# Patient Record
Sex: Female | Born: 2000 | Race: Black or African American | Hispanic: No | Marital: Single | Smoking: Never smoker
Health system: Southern US, Community
[De-identification: ages and names within clinical notes are randomized; demographics above are authoritative.]

## PROBLEM LIST (undated history)

## (undated) DIAGNOSIS — T7840XA Allergy, unspecified, initial encounter: Secondary | ICD-10-CM

## (undated) HISTORY — DX: Allergy, unspecified, initial encounter: T78.40XA

---
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
## 2022-01-04 DIAGNOSIS — O3680X0 Pregnancy with inconclusive fetal viability, not applicable or unspecified: Secondary | ICD-10-CM | POA: Diagnosis not present

## 2022-01-25 DIAGNOSIS — Z3401 Encounter for supervision of normal first pregnancy, first trimester: Secondary | ICD-10-CM | POA: Diagnosis not present

## 2022-01-25 DIAGNOSIS — Z113 Encounter for screening for infections with a predominantly sexual mode of transmission: Secondary | ICD-10-CM | POA: Diagnosis not present

## 2022-02-08 DIAGNOSIS — Z3401 Encounter for supervision of normal first pregnancy, first trimester: Secondary | ICD-10-CM | POA: Diagnosis not present

## 2022-02-22 DIAGNOSIS — O361990 Maternal care for other isoimmunization, unspecified trimester, not applicable or unspecified: Secondary | ICD-10-CM | POA: Insufficient documentation

## 2022-02-22 DIAGNOSIS — Z141 Cystic fibrosis carrier: Secondary | ICD-10-CM | POA: Diagnosis not present

## 2022-02-22 DIAGNOSIS — O361920 Maternal care for other isoimmunization, second trimester, not applicable or unspecified: Secondary | ICD-10-CM | POA: Diagnosis not present

## 2022-02-22 DIAGNOSIS — Z3A13 13 weeks gestation of pregnancy: Secondary | ICD-10-CM | POA: Diagnosis not present

## 2022-03-30 DIAGNOSIS — O352XX0 Maternal care for (suspected) hereditary disease in fetus, not applicable or unspecified: Secondary | ICD-10-CM | POA: Diagnosis not present

## 2022-03-30 DIAGNOSIS — Z141 Cystic fibrosis carrier: Secondary | ICD-10-CM | POA: Diagnosis not present

## 2022-03-30 DIAGNOSIS — Z8279 Family history of other congenital malformations, deformations and chromosomal abnormalities: Secondary | ICD-10-CM | POA: Diagnosis not present

## 2022-03-30 DIAGNOSIS — Z3686 Encounter for antenatal screening for cervical length: Secondary | ICD-10-CM | POA: Diagnosis not present

## 2022-03-30 DIAGNOSIS — Z3A18 18 weeks gestation of pregnancy: Secondary | ICD-10-CM | POA: Diagnosis not present

## 2022-03-30 DIAGNOSIS — O360920 Maternal care for other rhesus isoimmunization, second trimester, not applicable or unspecified: Secondary | ICD-10-CM | POA: Diagnosis not present

## 2022-03-30 DIAGNOSIS — O99210 Obesity complicating pregnancy, unspecified trimester: Secondary | ICD-10-CM | POA: Diagnosis not present

## 2022-04-24 ENCOUNTER — Encounter (HOSPITAL_BASED_OUTPATIENT_CLINIC_OR_DEPARTMENT_OTHER): Payer: Self-pay | Admitting: Advanced Practice Midwife

## 2022-04-24 ENCOUNTER — Ambulatory Visit (INDEPENDENT_AMBULATORY_CARE_PROVIDER_SITE_OTHER): Payer: Medicaid Other | Admitting: Advanced Practice Midwife

## 2022-04-24 VITALS — BP 127/67 | HR 94 | Wt 240.8 lb

## 2022-04-24 DIAGNOSIS — M549 Dorsalgia, unspecified: Secondary | ICD-10-CM

## 2022-04-24 DIAGNOSIS — Z3402 Encounter for supervision of normal first pregnancy, second trimester: Secondary | ICD-10-CM

## 2022-04-24 DIAGNOSIS — Z3400 Encounter for supervision of normal first pregnancy, unspecified trimester: Secondary | ICD-10-CM | POA: Insufficient documentation

## 2022-04-24 DIAGNOSIS — O99891 Other specified diseases and conditions complicating pregnancy: Secondary | ICD-10-CM

## 2022-04-24 MED ORDER — BLOOD PRESSURE KIT DEVI: 1.0000 | Freq: Once | 0 refills | Status: AC

## 2022-04-24 NOTE — Progress Notes (Signed)
     Subjective:   Megan Howell is a 21 y.o. G1 at [redacted]w[redacted]d  by early ultrasound being seen today for her first obstetrical visit as a transfer from [place].  Her obstetrical history is significant for teen pregnancy,  and has Seasonal allergies; Supervision of normal first pregnancy; Maternal atypical antibody; and Cystic fibrosis carrier on their problem list.. Patient does intend to breast feed. Pregnancy history fully reviewed.  Patient reports no complaints.  HISTORY: OB History  Gravida Para Term Preterm AB Living  1 0 0 0 0 0  SAB IAB Ectopic Multiple Live Births  0 0 0 0 0    # Outcome Date GA Lbr Len/2nd Weight Sex Delivery Anes PTL Lv  1 Current            Past Medical History:  Diagnosis Date   Allergy    seasonal allergies   Past Surgical History:  Procedure Laterality Date   ADENOIDECTOMY  07/2011   Dr. Benjamine Mola   TONSILLECTOMY  07/2011   No family history on file. Social History   Tobacco Use   Smoking status: Never   Smokeless tobacco: Never   No Known Allergies Current Outpatient Medications on File Prior to Visit  Medication Sig Dispense Refill   ferrous sulfate 325 (65 FE) MG tablet Take 325 mg by mouth daily with breakfast.     Prenatal Vit-Fe Fumarate-FA (PRENATAL MULTIVITAMIN) TABS tablet Take 1 tablet by mouth daily at 12 noon.     fexofenadine (ALLEGRA) 60 MG tablet Take 1 tablet (60 mg total) by mouth 2 (two) times daily. For allergy symptom control (Patient not taking: Reported on 04/24/2022) 60 tablet 6   fluticasone (FLONASE) 50 MCG/ACT nasal spray Sniff one spray into each nostril once daily for allergy symptom control (Patient not taking: Reported on 04/24/2022) 16 g 12   Vitamin D, Ergocalciferol, (DRISDOL) 1.25 MG (50000 UNIT) CAPS capsule Take one capsule by mouth once every 7 days for total of 8 weeks to treat low vitamin D level (Patient not taking: Reported on 04/24/2022) 8 capsule 0   No current facility-administered medications on file prior  to visit.    Exam   There were no vitals filed for this visit.     VS reviewed, nursing note reviewed,  Constitutional: well developed, well nourished, no distress HEENT: normocephalic CV: normal rate Pulm/chest wall: normal effort Abdomen: soft Neuro: alert and oriented x 3 Skin: warm, dry Psych: affect normal   Assessment:   Pregnancy: No obstetric history on file. 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 Back pain affecting pregnancy in second trimester --Rest/ice/heat/warm bath/increase PO fluids/Tylenol/pregnancy support belt  - Ambulatory referral to Physical Therapy  2. Encounter for supervision of normal first pregnancy in second trimester --Anticipatory guidance about next visits/weeks of pregnancy given.   - [place] MFM OB DETAIL +14 WK; Future - Babyscripts Schedule Optimization    Initial labs reviewed Continue prenatal vitamins. Ultrasound discussed; fetal anatomic survey: ordered. Problem list reviewed and updated. The nature of [facility] with multiple MDs and other Advanced Practice Providers was explained to patient; also emphasized that residents, students are part of our team. Routine obstetric precautions reviewed.  F/U in 4 weeks   Fatima Blank, CNM 04/26/22 8:51 PM

## 2022-05-14 ENCOUNTER — Ambulatory Visit: Payer: Medicaid Other | Admitting: *Deleted

## 2022-05-14 ENCOUNTER — Ambulatory Visit: Payer: Medicaid Other | Attending: Obstetrics & Gynecology

## 2022-05-14 VITALS — BP 132/58 | HR 93

## 2022-05-14 DIAGNOSIS — Z8279 Family history of other congenital malformations, deformations and chromosomal abnormalities: Secondary | ICD-10-CM | POA: Diagnosis not present

## 2022-05-14 DIAGNOSIS — Z3402 Encounter for supervision of normal first pregnancy, second trimester: Secondary | ICD-10-CM

## 2022-05-14 DIAGNOSIS — O99212 Obesity complicating pregnancy, second trimester: Secondary | ICD-10-CM | POA: Diagnosis not present

## 2022-05-14 DIAGNOSIS — O361910 Maternal care for other isoimmunization, first trimester, not applicable or unspecified: Secondary | ICD-10-CM | POA: Diagnosis not present

## 2022-05-14 DIAGNOSIS — Z3A25 25 weeks gestation of pregnancy: Secondary | ICD-10-CM | POA: Diagnosis not present

## 2022-05-14 DIAGNOSIS — Z363 Encounter for antenatal screening for malformations: Secondary | ICD-10-CM | POA: Insufficient documentation

## 2022-05-14 DIAGNOSIS — O361920 Maternal care for other isoimmunization, second trimester, not applicable or unspecified: Secondary | ICD-10-CM | POA: Diagnosis not present

## 2022-05-14 DIAGNOSIS — O09892 Supervision of other high risk pregnancies, second trimester: Secondary | ICD-10-CM | POA: Insufficient documentation

## 2022-05-14 DIAGNOSIS — E669 Obesity, unspecified: Secondary | ICD-10-CM

## 2022-05-15 ENCOUNTER — Other Ambulatory Visit: Payer: Self-pay | Admitting: *Deleted

## 2022-05-15 DIAGNOSIS — O99212 Obesity complicating pregnancy, second trimester: Secondary | ICD-10-CM

## 2022-05-15 DIAGNOSIS — O09892 Supervision of other high risk pregnancies, second trimester: Secondary | ICD-10-CM

## 2022-05-15 DIAGNOSIS — O361920 Maternal care for other isoimmunization, second trimester, not applicable or unspecified: Secondary | ICD-10-CM

## 2022-05-23 ENCOUNTER — Ambulatory Visit (INDEPENDENT_AMBULATORY_CARE_PROVIDER_SITE_OTHER): Payer: Medicaid Other | Admitting: Obstetrics & Gynecology

## 2022-05-23 VITALS — BP 115/58 | HR 96 | Wt 249.0 lb

## 2022-05-23 DIAGNOSIS — Z23 Encounter for immunization: Secondary | ICD-10-CM | POA: Diagnosis not present

## 2022-05-23 DIAGNOSIS — Z3402 Encounter for supervision of normal first pregnancy, second trimester: Secondary | ICD-10-CM | POA: Diagnosis not present

## 2022-05-23 DIAGNOSIS — Z141 Cystic fibrosis carrier: Secondary | ICD-10-CM

## 2022-05-23 DIAGNOSIS — O0932 Supervision of pregnancy with insufficient antenatal care, second trimester: Secondary | ICD-10-CM

## 2022-05-23 DIAGNOSIS — O361920 Maternal care for other isoimmunization, second trimester, not applicable or unspecified: Secondary | ICD-10-CM

## 2022-05-23 DIAGNOSIS — Z6837 Body mass index (BMI) 37.0-37.9, adult: Secondary | ICD-10-CM

## 2022-05-23 MED ORDER — ASPIRIN 81 MG PO TBEC: 81.0000 mg | DELAYED_RELEASE_TABLET | Freq: Every day | ORAL | 12 refills | Status: AC

## 2022-05-23 NOTE — Patient Instructions (Signed)
AREA PEDIATRIC/FAMILY PRACTICE PHYSICIANS  [place]outheast [place] ([address])  [facility] Chambliss, MD; Eniola, MD; Hale, MD; Hensel, MD; McDiarmid, MD; McIntyer, MD; Neal, MD; Walden, MD [address] [place], [place] [address] [phone] Mon-Fri 8:30-12:30, 1:30-5:00 Providers come to see babies at [facility] Accepting Medicaid [facility] providers who accept newborns: Koirala, MD; Morrow, MD; Wolters, MD [address], [place], [place] [address] [phone] Mon-Fri 8:00-5:30 Babies seen by providers at [facility] Does NOT accept Medicaid Please call early in hospitalization for appointment (limited availability)  [facility] Mulberry, MD [address] [place], [place] [address] [phone] Mon, Tue, Thur, Fri 8:30-5:00, Wed 10:00-7:00 (closed 1-2pm) Babies seen by [facility] providers Accepting Medicaid Rubin - Pediatrician Rubin, MD [address], [place], [place] [address] [phone] Mon-Fri 8:30-5:00, Sat 8:30-12:00 Provider comes to see babies at [facility] Accepting Medicaid Must have been referred from current patients or contacted office prior to delivery [facility] ([facility]) Brown, MD; Chandler, MD; Ettefagh, MD; Grant, MD; Lester, MD; McCormick, MD; McQueen, MD; Prose, MD; Simha, MD; Stanley, MD; Stryffeler, NP; Tebben, NP [address], [place], [place] [address] [phone] Mon, Tue, Thur, Fri 8:30-5:30, Wed 9:30-5:30, Sat 8:30-12:30 Babies seen by [facility] providers Accepting Medicaid Only accepting infants of first-time parents or siblings of current patients Hospital discharge coordinator will make follow-up appointment Jack Amos [address], [place], [place]  [address] [phone]   Fax - [phone] [facility] [address], [place], [place]  [address] Phone - [phone]   Fax - [phone] Shilpa Gosrani [address], [place], [place]  [address] [phone]  [place]ortheast [place] ([address]) [facility] Bates, MD; [place]assfield, MD; Cooper, Cox, MD; MD; Davis, MD; Dovico, MD; Ettefaugh, MD; Little, MD; Lowe, MD; Keiffer, MD; Melvin, MD; Sumner, MD; Williams, MD [address], [place], [place] [address] [phone] Mon-Fri 8:30-5:00 (extended evenings Mon-Thur as needed), Sat-Sun 10:00-1:00 Providers come to see babies at [facility] Accepting Medicaid for families of first-time babies and families with all children in the household age 3 and under. Must register with office prior to making appointment (M-F only). [facility] Henson, NP; Knapp, MD; Lalonde, MD; Tysinger, PA [address] [place], [place] [address] [phone] Mon-Fri 8:00-5:00 Babies seen by providers at [facility] Does NOT accept Medicaid/Commercial Insurance Only [facility] ([facility])  Artis, MD; Barnes, MD; Bratton, MD; Coccaro, MD; Lockett Gardner, MD; Kramer, MD; Marshall, MD; Netherton, MD; Poleto, MD; Skinner, MD [address] [place], [place] [address] [phone] Mon-Fri 8:30-5:30, Sat (Oct.-Mar.) 9:00-1:00 Babies seen by providers at [facility] Accepting Medicaid  [place] ([address]) [facility] Reid, MD; Warner, MD [address], [place], [place] [address] [phone] Mon-Fri 8:30-5:00, Sat 8:30-12:00 Providers come to see babies at [facility] Does NOT accept Medicaid [facility] Becker, PA; Hagler, MD; Scifres, PA; Sun, MD; Swayne, MD [address], [place],  [address] [phone] Mon-Fri 8:00-5:00 Babies seen by providers at [facility] Does NOT accept Medicaid Only accepting babies of parents who  are patients Please call early in hospitalization for appointment (limited availability) [facility] Clark, MD; Frye, MD; Kelleher, MD; Mack, NP; Azriel Dancy, MD; O'Keller, MD; Patterson, NP; Pudlo, MD; Puzio, MD; Thomas, MD; Tucker, MD; Twiselton, MD [address], [place], [place] [address] [phone] Mon-Fri 8:00-5:00, Sat 9:00-12:00 Providers come to see babies at [facility] Does NOT accept Medicaid  [place] ([address]) [facility] providers accepting new patients: Brake, NP; Wharton, PA [address], [place], [place] [address] [phone] Mon-Fri 8:00-5:00 Babies seen by providers at [facility] Does NOT accept Medicaid Only accepting babies of parents who are patients Please call early in hospitalization for appointment (limited availability) [facility] Gay, MD; Quinlan, MD [address] [place], [place] [address] [phone] (press 1 to schedule appointment) Mon-Fri 8:00-5:00 Providers come to see babies at [facility] Does NOT accept Medicaid [facility] Mazer, MD [address] [place], [place] [address] [phone] Mon-Fri 8:30-5:00 (lunch 12:30-1:00), extended hours by appointment only Wed 5:00-6:30 Babies seen by [facility] providers Accepting Medicaid [facility] Banks, MD; Jordan, MD; Koberlein, MD [address], [place], [place] [address] [phone] Mon-Fri 8:00-5:00 Babies seen by [facility] providers Does NOT accept Medicaid [facility] Acuna, MD; Hunter, MD; Wallace, DO [address] [place], [place] [address] [phone] Mon-Fri 8:00-5:00 Babies seen by [facility] providers Does NOT accept Medicaid [facility] Brandon, PA; Brecken, PA; Christy, NP; Dees, MD; DeClaire, MD; DeWeese, MD; Hansen, NP; Mills, NP; Parrish, NP; Smoot, NP; Summer, MD; Vapne,  MD [address] [place], [place] [address] [phone] Mon-Fri 8:30-5:00, Sat 10:00-1:00 Providers come to see babies at [facility] Does NOT accept Medicaid Free prenatal information session Tuesdays at 4:45pm [facility] Bouska, MD; Gordon, PA; Jeffery, PA; Weber, PA [address] [place] [address] [phone] Mon-Fri 7:30-5:30 Babies seen by [facility] providers [facility] [address], [place], [place]  [address] [phone]   Fax - [phone]  [place] ([address] & [address]) [facility] [place] [address] [place], [place] [address] [phone] Mon-Thur 8:00-6:00 Providers come to see babies at [facility] Accepting Medicaid [facility] Anderson, NP; [place], MD; Beal, PA; Spencer, PA [address] [place], [place] [address] [phone] Mon-Thur 7:30-7:30, Fri 7:30-4:30 Babies seen by [facility] providers Accepting Medicaid [facility] Agbuya, MD; Klett, NP; Romgoolam, MD [address], [place], [place] [address] [phone] Mon-Fri 8:30-5:00, Sat 8:30-12:00 Providers come to see babies at [facility] Accepting Medicaid Must have "Meet & Greet" appointment at office prior to delivery [facility] ([facility]) McCord, MD; Wallace, MD; Wood, MD [address], [place], [place] [address] [phone] Mon-Wed 8:00-6:00, Thur-Fri 8:00-5:00, Sat 9:00-12:00 Providers come to see babies at [facility] Does NOT accept Medicaid Only accepting siblings of current patients [facility]  [address], [place], [place]  [address] [phone]   Fax - [phone] [facility] [address], [place], [place]  [address] [phone]   Fax -  [phone]  [place] ([address] & [address]) [facility] Cirigliano, DO; Matthews, DO [address] [place], [place] [address] [phone] Mon-Fri 7:00-5:00 Babies seen by [facility] providers Does NOT accept Medicaid [facility] Briscoe, MD; Howley, PA; Moreira, PA [address], [place], [place] [address] [phone] Mon-Fri 8:00-5:00 Babies seen by [facility] providers Accepting Medicaid [facility] Boyd, MD; Church, PA; Jones, NP; Osborn, PA [address], [place], [place] [address] [phone] Mon-Fri 8:00-5:00 Babies seen by providers at [facility] Accepting Medicaid  [place] ([address]) [facility] [place] Wendling, DO [address] [place], [place] [address] [phone] Mon-Fri 8:00-5:00 Babies seen by [facility] providers Does NOT accept Medicaid Limited availability, please call early in hospitalization to schedule follow-up [facility] Calderon, PA; Cummings, MD; Dillard, MD; Martin, PA; Olson, MD; VanDeven, PA [address], [place], [place] [address] [phone] Mon-Fri 8:30-5:00, Sat 9:00-12:00 Babies seen by providers at [facility] Accepting Medicaid Please register online then schedule online or call office [email or web address] Hunter, NP; Kumar, MD; Martin Rogers, PA [address], [place], [place] [address] [phone] Mon-Fri 8:00-5:00 Babies seen by providers at [facility] Accepting Medicaid [facility] ([facility]) Elgin, MD; Kristi Fleenor, NP; West, MD [address], [place], [place] [address] [phone] Mon-Fri 8:00-5:30, Sat&Sun by appointment (phones open at  8:30) Babies seen by [facility] providers Accepting Medicaid Must be a first-time baby or sibling of current patient [facility] [place]  [address], [place], [place]  [address] [phone]   Fax - [phone]  [place] ([address]) [facility] Brown, PA; Cowen, PA; Rice, MD; Helton, PA; Spry, MD [address] [place], [place] [address] [phone] Mon-Thur 8:00-7:00, Fri 8:00-5:00, Sat 8:00-12:00, Sun 9:00-12:00 Babies seen by [facility] providers Accepting Medicaid [facility] Coe-Goins, MD; Marshall, MD; Pierre-Louis, MD [address], [place], [place] [address] [phone] Mon-Thur 8:00-5:00 Babies seen by providers at [facility] Accepting Medicaid [facility] Bratton, MD; Coe-Goins, MD; Hayes, MD; Lewis, MD; List, MD; Lott, MD; Marshall, MD; Moran, MD; O'Neal, MD; Pierre-Louis, MD; Pitonzo, MD; Scholer, MD; Spangle, MD [address] [place], [place] [address] [phone] Mon-Fri 8:00-5:30, Sat (Oct.-Mar.) 9:00-1:00 Babies seen by providers at [facility] Accepting Medicaid Must fill out new patient packet, available online at [email or web address] [facility] ([facility]) Friddle, NP; Harris, NP; Kelly, NP; Logan, MD; Melvin, PA; Poth, MD; Ramadoss, MD; Stanton, NP [address], [place], [place] [address] [phone] Mon-Thur 8:00-5:30, Fri 8:00-5:00 Babies seen by providers at [facility] Accepting Medicaid  [place] ([address]) [facility] Dixon, PA; , MD; Pickard, MD; Tapia, PA [address], [place], [place] [address] [phone] Mon-Fri 8:00-5:00 Babies seen by providers at [facility] Accepting Medicaid   [place] ([address]) [facility] [place] Masneri, DO; Meyers, MD; Nelson, PA [address], [place], [place] [address] [phone] Mon-Fri 8:00-5:00 Babies seen by providers at [facility] Does NOT accept Medicaid Limited appointment availability, please call early in hospitalization  [facility] [place] Kunedd, DO; McGowen, MD [address], [place], [place] [address] [phone] Mon-Fri 8:00-5:00 Babies seen by [facility] providers Does NOT accept Medicaid [facility] [place] Cameron, MD; MacDonald, MD; Michaels, PA; Nayak, MD [address] [place] [address], [place], [place] [address] [phone] Mon-Fri 8:00-5:00 After hours clinic ([address] [place],  [address]) [phone] Mon-Fri 5:00-8:00, Sat 12:00-6:00, Sun 10:00-4:00 Babies seen by [facility] providers Accepting Medicaid [facility] [place] [address], [place], [place]  [address] [phone]   Fax - [phone]  [place] ([address]) [facility] [place] [facility] Andy, MD [address], [place], [place] [address] [phone] Mon-Fri 8:00-5:00 Babies seen by [facility] providers Does NOT accept Medicaid [facility] [place] ([facility] [place]) Eksir, MD [address], [place], [place] [address] [phone] Mon-Thur 8:00-7:00, Fri 8:00-5:00, Sat 8:00-12:00 Babies seen by providers at [facility] Accepting Medicaid - but does not have vaccinations in office (must be received elsewhere) Limited availability, please call early in hospitalization  [place] ([address]) [facility]  Charlene Flemming, MD [address],  [place] [address] [phone]  Fax [phone]  [facility]  Kimberly Newton, MD, Annamarie Streilein, PA, Carla [place], PA [address] [place], [place]  [address] [phone] [facility]  [address], [place], [place] [address] [phone] [address], [place], [place] [address] [phone] (West Office)  [facility] [address], [place], [place] [address] [phone] [facility] [address] [place], [place], [place] [address] [phone] [facility] [address], [place], [place] [address] [phone] [facility] [address], [place], [place] [address] [phone] [facility] [address], [place], [place] [address] [phone] [facility] [address], [place], [place] [address] [phone] [facility]  [address], [place], [place] [address] [phone] Dr[facility] [address] [place] [address], [place], [place] [address] [phone] [facility] [address], [place], [place] [address] [phone] [facility] [address], , [place] [address] [phone]  

## 2022-05-24 LAB — CBC
Hematocrit: 32.1 % — ABNORMAL LOW (ref 34.0–46.6)
Hemoglobin: 10.3 g/dL — ABNORMAL LOW (ref 11.1–15.9)
MCH: 22.5 pg — ABNORMAL LOW (ref 26.6–33.0)
MCHC: 32.1 g/dL (ref 31.5–35.7)
MCV: 70 fL — ABNORMAL LOW (ref 79–97)
Platelets: 239 10*3/uL (ref 150–450)
RBC: 4.57 x10E6/uL (ref 3.77–5.28)
RDW: 14.3 % (ref 11.7–15.4)
WBC: 9.1 10*3/uL (ref 3.4–10.8)

## 2022-05-24 LAB — GLUCOSE TOLERANCE, 2 HOURS W/ 1HR
Glucose, 1 hour: 105 mg/dL (ref 70–179)
Glucose, 2 hour: 100 mg/dL (ref 70–152)
Glucose, Fasting: 80 mg/dL (ref 70–91)

## 2022-05-24 LAB — HIV ANTIBODY (ROUTINE TESTING W REFLEX): HIV Screen 4th Generation wRfx: NONREACTIVE

## 2022-05-24 LAB — RPR: RPR Ser Ql: NONREACTIVE

## 2022-05-26 NOTE — Progress Notes (Signed)
   PRENATAL VISIT NOTE  Subjective:  Megan Howell is a 21 y.o. G1P0 at [redacted]w[redacted]d being seen today for ongoing prenatal care.  She is currently monitored for the following issues for this low-risk pregnancy and has Seasonal allergies; Supervision of normal first pregnancy; Maternal atypical antibody; and Cystic fibrosis carrier on their problem list.  Patient reports no complaints.  Contractions: Not present. Vag. Bleeding: None.  Movement: Present. Denies leaking of fluid.   The following portions of the patient's history were reviewed and updated as appropriate: allergies, current medications, past family history, past medical history, past social history, past surgical history and problem list.   Objective:   Vitals:   05/23/22 0950  BP: (!) 115/58  Pulse: 96  Weight: 249 lb (112.9 kg)    Fetal Status: Fetal Heart Rate (bpm): 148   Movement: Present     General:  Alert, oriented and cooperative. Patient is in no acute distress.  Skin: Skin is warm and dry. No rash noted.   Cardiovascular: Normal heart rate noted  Respiratory: Normal respiratory effort, no problems with respiration noted  Abdomen: Soft, gravid, appropriate for gestational age.  Pain/Pressure: Absent     Pelvic: Cervical exam deferred        Extremities: Normal range of motion.  Edema: None  Mental Status: Normal mood and affect. Normal behavior. Normal judgment and thought content.   Assessment and Plan:  Pregnancy: G1P0 at [redacted]w[redacted]d 1. Encounter for supervision of normal first pregnancy in second trimester - on PNV and baby ASA - Glucose Tolerance, 2 Hours w/1 Hour - CBC - HIV Antibody (routine testing w rflx) - RPR - Tdap vaccine greater than or equal to 7yo IM  2. Late prenatal care affecting pregnancy in second trimester - transfer of care from [facility]  3. BMI 37.0-37.9, adult  4. Cystic fibrosis carrier - pt aware.  FOB declines testing but no family hx  5. 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 Please refer to After Visit Summary for other counseling recommendations.   Return in about 4 weeks (around 06/20/2022).  Future Appointments  Date Time Provider [place]  06/18/2022  1:15 PM [facility] NURSE [facility]  06/18/2022  1:30 PM [facility] US2 [facility]  06/19/2022 10:35 AM Leftwich-Kirby, Kathie Dike, CNM [facility]  07/03/2022  2:35 PM Leftwich-Kirby, Kathie Dike, CNM [facility]  07/19/2022  1:00 PM Megan Salon, MD [facility]  07/31/2022 10:55 AM Amedeo Gory, Kathie Dike, CNM [facility]  08/06/2022 10:35 AM Megan Salon, MD [facility]  08/14/2022 10:55 AM Amedeo Gory, Kathie Dike, CNM [facility]  08/20/2022  1:35 PM Megan Salon, MD [facility]  08/27/2022  1:35 PM Megan Salon, MD [facility]    Megan Salon, MD

## 2022-06-01 ENCOUNTER — Encounter (HOSPITAL_BASED_OUTPATIENT_CLINIC_OR_DEPARTMENT_OTHER): Payer: Self-pay | Admitting: Advanced Practice Midwife

## 2022-06-04 ENCOUNTER — Other Ambulatory Visit (HOSPITAL_COMMUNITY)
Admission: RE | Admit: 2022-06-04 | Discharge: 2022-06-04 | Disposition: A | Discharge status: Home / Self Care | Payer: Medicaid Other | Source: Ambulatory Visit | Attending: Obstetrics & Gynecology | Admitting: Obstetrics & Gynecology

## 2022-06-04 ENCOUNTER — Ambulatory Visit (INDEPENDENT_AMBULATORY_CARE_PROVIDER_SITE_OTHER): Payer: Medicaid Other | Admitting: Obstetrics & Gynecology

## 2022-06-04 VITALS — BP 123/76 | HR 92 | Wt 248.4 lb

## 2022-06-04 DIAGNOSIS — O361930 Maternal care for other isoimmunization, third trimester, not applicable or unspecified: Secondary | ICD-10-CM | POA: Diagnosis not present

## 2022-06-04 DIAGNOSIS — Z6837 Body mass index (BMI) 37.0-37.9, adult: Secondary | ICD-10-CM | POA: Diagnosis not present

## 2022-06-04 DIAGNOSIS — Z3403 Encounter for supervision of normal first pregnancy, third trimester: Secondary | ICD-10-CM

## 2022-06-04 DIAGNOSIS — N898 Other specified noninflammatory disorders of vagina: Secondary | ICD-10-CM | POA: Insufficient documentation

## 2022-06-04 DIAGNOSIS — Z141 Cystic fibrosis carrier: Secondary | ICD-10-CM | POA: Diagnosis not present

## 2022-06-04 DIAGNOSIS — Z3A28 28 weeks gestation of pregnancy: Secondary | ICD-10-CM | POA: Diagnosis not present

## 2022-06-04 MED ORDER — TERCONAZOLE 0.4 % VA CREA: 1.0000 | TOPICAL_CREAM | Freq: Every day | VAGINAL | 0 refills | Status: AC

## 2022-06-05 LAB — CERVICOVAGINAL ANCILLARY ONLY
Bacterial Vaginitis (gardnerella): NEGATIVE
Candida Glabrata: NEGATIVE
Candida Vaginitis: NEGATIVE
Comment: NEGATIVE
Comment: NEGATIVE
Comment: NEGATIVE

## 2022-06-07 DIAGNOSIS — Z6837 Body mass index (BMI) 37.0-37.9, adult: Secondary | ICD-10-CM | POA: Insufficient documentation

## 2022-06-07 NOTE — Progress Notes (Signed)
   PRENATAL VISIT NOTE  Subjective:  Megan Howell is a 21 y.o. G1P0 at [redacted]w[redacted]d being seen today for two complaints.  This is a problem visit that is out of cycle of the normal ob visit cadence as she was just seen on 11/1 and has next appt 11/28.  She is concerned about amount of vaginal discharge that is present.  No significant itching or odor.  Has no new partner.   No urinary symptoms.  Has also noted increased SOB after eating.  Still eating typical regular sized meals.  Discussed physiologic changed in abdomen with less space for stomach to to start eating some smaller, more frequent meals.    Her other medical/pregnancy conditions include:   has Seasonal allergies; Supervision of normal first pregnancy; Maternal atypical antibody; Cystic fibrosis carrier; and BMI 37.0-37.9, adult on their problem list.  Contractions: Irregular. Vag. Bleeding: None.  Movement: Present. Denies leaking of fluid.   The following portions of the patient's history were reviewed and updated as appropriate: allergies, current medications, past family history, past medical history, past social history, past surgical history and problem list.   Objective:   Vitals:   06/04/22 1544  BP: 123/76  Pulse: 92  Weight: 248 lb 6.4 oz (112.7 kg)    Fetal Status: Fetal Heart Rate (bpm): 141 Fundal Height: 29 cm Movement: Present     General:  Alert, oriented and cooperative. Patient is in no acute distress.  Skin: Skin is warm and dry. No rash noted.   Cardiovascular: Normal heart rate noted  Respiratory: Normal respiratory effort, no problems with respiration noted  Abdomen: Soft, gravid, appropriate for gestational age.  Pain/Pressure: Present     Pelvic: Exam done with chaperone present.  NAEFG, some watery discharge is present, no odor  Extremities: Normal range of motion.  Edema: Trace  Mental Status: Normal mood and affect. Normal behavior. Normal judgment and thought content.   Assessment and Plan:   Pregnancy: G1P0 at [redacted]w[redacted]d 1. Vaginal discharge - rx for terazol 7 given as pt desires treatment to start today - Cervicovaginal ancillary only( [place])  2. [redacted] weeks gestation of pregnancy - return for regular appt in about two weeks  3. Encounter for supervision of normal first pregnancy in third trimester  4. Maternal atypical antibody affecting pregnancy in third trimester, single or unspecified fetus - anti-M  5. Cystic fibrosis carrier - FOB has not done testing  6.  Maternal obesity - growth scan is scheduled this next week with MFM  Preterm labor symptoms and general obstetric precautions including but not limited to vaginal bleeding, contractions, leaking of fluid and fetal movement were reviewed in detail with the patient. Please refer to After Visit Summary for other counseling recommendations.   Return in about 15 days (around 06/19/2022).  Future Appointments  Date Time Provider Department Center  06/18/2022  1:15 PM [facility] NURSE [facility]  06/18/2022  1:30 PM [facility] US2 [facility]  06/19/2022 10:35 AM Leftwich-Kirby, Wilmer Floor, CNM [facility]  07/03/2022  2:35 PM Leftwich-Kirby, Wilmer Floor, CNM [facility]  07/19/2022  1:00 PM Jerene Bears, MD [facility]  07/31/2022 10:55 AM Courtney [place], Wilmer Floor, CNM [facility]  08/06/2022 10:35 AM Jerene Bears, MD [facility]  08/14/2022 10:55 AM Courtney [place], Wilmer Floor, CNM [facility]  08/20/2022  1:35 PM Jerene Bears, MD [facility]  08/27/2022  1:35 PM Jerene Bears, MD [facility]    Jerene Bears, MD

## 2022-06-18 ENCOUNTER — Ambulatory Visit: Payer: Medicaid Other | Admitting: *Deleted

## 2022-06-18 ENCOUNTER — Encounter: Payer: Self-pay | Admitting: *Deleted

## 2022-06-18 ENCOUNTER — Ambulatory Visit: Payer: Medicaid Other | Attending: Obstetrics

## 2022-06-18 VITALS — BP 130/63 | HR 88

## 2022-06-18 DIAGNOSIS — Z3A30 30 weeks gestation of pregnancy: Secondary | ICD-10-CM | POA: Diagnosis not present

## 2022-06-18 DIAGNOSIS — O99283 Endocrine, nutritional and metabolic diseases complicating pregnancy, third trimester: Secondary | ICD-10-CM | POA: Diagnosis not present

## 2022-06-18 DIAGNOSIS — Z362 Encounter for other antenatal screening follow-up: Secondary | ICD-10-CM | POA: Diagnosis not present

## 2022-06-18 DIAGNOSIS — O361920 Maternal care for other isoimmunization, second trimester, not applicable or unspecified: Secondary | ICD-10-CM | POA: Insufficient documentation

## 2022-06-18 DIAGNOSIS — Z8279 Family history of other congenital malformations, deformations and chromosomal abnormalities: Secondary | ICD-10-CM | POA: Insufficient documentation

## 2022-06-18 DIAGNOSIS — O99213 Obesity complicating pregnancy, third trimester: Secondary | ICD-10-CM | POA: Diagnosis not present

## 2022-06-18 DIAGNOSIS — Z141 Cystic fibrosis carrier: Secondary | ICD-10-CM | POA: Insufficient documentation

## 2022-06-18 DIAGNOSIS — O09893 Supervision of other high risk pregnancies, third trimester: Secondary | ICD-10-CM | POA: Insufficient documentation

## 2022-06-18 DIAGNOSIS — O09892 Supervision of other high risk pregnancies, second trimester: Secondary | ICD-10-CM | POA: Diagnosis present

## 2022-06-18 DIAGNOSIS — O09293 Supervision of pregnancy with other poor reproductive or obstetric history, third trimester: Secondary | ICD-10-CM | POA: Diagnosis not present

## 2022-06-18 DIAGNOSIS — Z3403 Encounter for supervision of normal first pregnancy, third trimester: Secondary | ICD-10-CM

## 2022-06-18 DIAGNOSIS — O99212 Obesity complicating pregnancy, second trimester: Secondary | ICD-10-CM | POA: Insufficient documentation

## 2022-06-18 DIAGNOSIS — E669 Obesity, unspecified: Secondary | ICD-10-CM

## 2022-06-18 DIAGNOSIS — O361930 Maternal care for other isoimmunization, third trimester, not applicable or unspecified: Secondary | ICD-10-CM | POA: Insufficient documentation

## 2022-06-19 ENCOUNTER — Encounter (HOSPITAL_BASED_OUTPATIENT_CLINIC_OR_DEPARTMENT_OTHER): Payer: Self-pay | Admitting: Advanced Practice Midwife

## 2022-06-19 ENCOUNTER — Telehealth (INDEPENDENT_AMBULATORY_CARE_PROVIDER_SITE_OTHER): Payer: Medicaid Other | Admitting: Advanced Practice Midwife

## 2022-06-19 VITALS — BP 139/84

## 2022-06-19 DIAGNOSIS — Z3A30 30 weeks gestation of pregnancy: Secondary | ICD-10-CM

## 2022-06-19 DIAGNOSIS — Z3403 Encounter for supervision of normal first pregnancy, third trimester: Secondary | ICD-10-CM

## 2022-06-19 DIAGNOSIS — O361930 Maternal care for other isoimmunization, third trimester, not applicable or unspecified: Secondary | ICD-10-CM

## 2022-06-19 NOTE — Progress Notes (Signed)
OBSTETRICS PRENATAL VIRTUAL VISIT ENCOUNTER NOTE  Provider location: [facility] [place]   Patient location: Home  I connected with Megan Howell on 06/19/22 at 10:35 AM EST by MyChart Video Encounter and verified that I am speaking with the correct person using two identifiers. I discussed the limitations, risks, security and privacy concerns of performing an evaluation and management service virtually and the availability of in person appointments. I also discussed with the patient that there may be a patient responsible charge related to this service. The patient expressed understanding and agreed to proceed. Subjective:  Megan Howell is a 21 y.o. G1P0 at [redacted]w[redacted]d being seen today for ongoing prenatal care.  She is currently monitored for the following issues for this low-risk pregnancy and has Seasonal allergies; Supervision of normal first pregnancy; Maternal atypical antibody; Cystic fibrosis carrier; and BMI 37.0-37.9, adult on their problem list.  Patient reports no complaints.  Contractions: Not present.  .  Movement: Present. Denies any leaking of fluid.   The following portions of the patient's history were reviewed and updated as appropriate: allergies, current medications, past family history, past medical history, past social history, past surgical history and problem list.   Objective:   Vitals:   06/19/22 1055 06/19/22 1057  BP: (!) 145/89 139/84    Fetal Status:     Movement: Present     General:  Alert, oriented and cooperative. Patient is in no acute distress.  Respiratory: Normal respiratory effort, no problems with respiration noted  Mental Status: Normal mood and affect. Normal behavior. Normal judgment and thought content.  Rest of physical exam deferred due to type of encounter  Imaging: [place] MFM OB FOLLOW UP  Result Date: 06/18/2022 ----------------------------------------------------------------------  OBSTETRICS REPORT                        (Signed Final 06/18/2022 02:02 pm) ---------------------------------------------------------------------- Patient Info  ID #:       [id number]                          D.O.B.:  2001/07/06 (21 yrs)  Name:       Megan Howell                 Visit Date: 06/18/2022 01:12 pm ---------------------------------------------------------------------- Performed By  Attending:        Tama High MD        Ref. Address:     [address]                                                             [address]  [place], [place]  Performed By:     Nathen May       [facility]  Referred By:      [facility] ---------------------------------------------------------------------- Orders  #  Description                           Code        Ordered By  1  [place] MFM OB FOLLOW UP                   [phone]    Peterson Ao ----------------------------------------------------------------------  #  Order #                     Accession #                Episode #  1  [id number]                   [id number]                 [id number] ---------------------------------------------------------------------- Indications  Obesity complicating pregnancy, third          O99.213  trimester (BMI 35)  Cystic Fibrosis (CF) Carrier, third trimester  O09.893  Isoimmunization - Other (Anti-M)               O36.1910  Family history of congenital anomaly           Z82.79  Encounter for other antenatal screening        Z36.2  follow-up  [redacted] weeks gestation of pregnancy                 Z3A.30 ---------------------------------------------------------------------- Fetal Evaluation  Num Of Fetuses:  1  Fetal Heart Rate(bpm):  150  Cardiac Activity:       Observed  Presentation:           Cephalic  Placenta:               Posterior Fundal  P. Cord Insertion:      Previously Visualized  Amniotic Fluid  AFI FV:      Within normal limits  AFI Sum(cm)     %Tile       Largest Pocket(cm)  18.3            69          8  RUQ(cm)       RLQ(cm)       LUQ(cm)        LLQ(cm)  3.4           3.9           8              3  ---------------------------------------------------------------------- Biometry  BPD:      76.2  mm     G. Age:  30w 4d         56  %    CI:        73.32   %    70 - 86                                                          FL/HC:      21.4   %    19.2 - 21.4  HC:      282.8  mm     G. Age:  31w 0d         44  %    HC/AC:      1.03        0.99 - 1.21  AC:      275.1  mm     G. Age:  31w 4d         87  %    FL/BPD:     79.5   %    71 - 87  FL:       60.6  mm     G. Age:  31w 4d         77  %    FL/AC:      22.0   %    20 - 24  Est. FW:    1761  gm    3 lb 14 oz      85  % ---------------------------------------------------------------------- OB History  Blood Type:   O+  Gravidity:    1 ---------------------------------------------------------------------- Gestational Age  Clinical EDD:  30w 0d                                        EDD:   08/27/22  U/S Today:     31w 1d                                        EDD:   08/19/22  Best:  30w 0d     Det. ByLoman Chroman         EDD:   08/27/22                                      (01/04/22) ---------------------------------------------------------------------- Anatomy  Cranium:               Appears normal         LVOT:                   Previously seen  Cavum:                 Previously seen        Aortic Arch:            Previously seen  Ventricles:            Previously seen        Ductal Arch:            Previously seen   Choroid Plexus:        Previously seen        Diaphragm:              Appears normal  Cerebellum:            Previously seen        Stomach:                Appears normal, left                                                                        sided  Posterior Fossa:       Previously seen        Abdomen:                Appears normal  Nuchal Fold:           Not applicable ([id number]    Abdominal Wall:         Previously seen                         wks GA)  Face:                  Orbits and profile     Cord Vessels:           Previously seen                         previously seen  Lips:                  Previously seen        Kidneys:                Appear normal  Palate:                Previously seen        Bladder:                Appears normal  Thoracic:  Appears normal         Spine:                  Previously seen  Heart:                 Appears normal         Upper Extremities:      Previously seen                         (4CH, axis, and                         situs)  RVOT:                  Previously seen        Lower Extremities:      Previously seen  Other:  Female gender previously seen. VC, 3VV and 3VTV, Nasal bone,          lenses, maxilla, mandible and falx, Heels/feet and open hands/5th          digits previously visualized. ---------------------------------------------------------------------- Impression  Prepregnancy BMI 35.  Patient does not have gestational diabetes.  Fetal growth is appropriate for gestational age.  Amniotic fluid  is normal and good fetal activity seen.  Patient has anti-M antibodies.  Anti-M antibodies are IgM  antibodies and are cold agglutinins. They are not associated  with fetal hemolysis.  I reassured the patient. ---------------------------------------------------------------------- Recommendations  Follow-up as clinically indicated. ----------------------------------------------------------------------                  Noralee Space, MD Electronically Signed  Final Report   06/18/2022 02:02 pm ----------------------------------------------------------------------   Assessment and Plan:  Pregnancy: G1P0 at [redacted]w[redacted]d 1. Encounter for supervision of normal first pregnancy in third trimester --Pt reports good fetal movement, denies cramping, LOF, or vaginal bleeding  --Reviewed [place] from 06/18/22 which was normal, EFW 85%, no further Korea ordered --Discussed how we will measure FH in office, [place] as needed related to size --Pt asked about IOL prior to EDD. Discussed how elective IOL is not recommended, especially for G1. We will check pt cervix, if desired, and can discuss elective IOL for favorable cervix after 39 or 40 weeks, otherwise IOL at 41 weeks for postdates recommended if labor has not begun on its own. --BP elevated initially, retake wnl. Pt to take weekly and continue to enter into Bab[employer]s.  Reviewed s/sx of PEC, reasons to seek care.  2. [redacted] weeks gestation of pregnancy   3. Maternal atypical antibody affecting pregnancy in third trimester, single or unspecified fetus --Not associated with HDN, see MFM notes   Preterm labor symptoms and general obstetric precautions including but not limited to vaginal bleeding, contractions, leaking of fluid and fetal movement were reviewed in detail with the patient. I discussed the assessment and treatment plan with the patient. The patient was provided an opportunity to ask questions and all were answered. The patient agreed with the plan and demonstrated an understanding of the instructions. The patient was advised to call back or seek an in-person office evaluation/go to [facility] at [facility] for any urgent or concerning symptoms. Please refer to After Visit Summary for other counseling recommendations.   I provided 7 minutes of face-to-face time during this encounter.  Return in about 2 weeks (around 07/03/2022) for As scheduled.  Future Appointments  Date Time Provider Department Center  07/03/2022  2:35 PM Leftwich-Kirby, Kathie Dike, CNM [facility]  07/19/2022  1:00 PM Megan Salon, MD [facility]  07/31/2022 10:55 AM Amedeo Gory, Kathie Dike, CNM [facility]  08/06/2022 10:35 AM Megan Salon, MD [facility]  08/14/2022 10:55 AM Leftwich-Kirby, Kathie Dike, CNM [facility]  08/20/2022  1:35 PM Megan Salon, MD [facility]  08/27/2022  1:35 PM Megan Salon, MD [facility] [place], [place] [facility], [place]

## 2022-07-01 DIAGNOSIS — O26899 Other specified pregnancy related conditions, unspecified trimester: Secondary | ICD-10-CM | POA: Diagnosis not present

## 2022-07-01 DIAGNOSIS — Z3689 Encounter for other specified antenatal screening: Secondary | ICD-10-CM | POA: Diagnosis not present

## 2022-07-01 DIAGNOSIS — Z3A00 Weeks of gestation of pregnancy not specified: Secondary | ICD-10-CM | POA: Diagnosis not present

## 2022-07-01 DIAGNOSIS — Z043 Encounter for examination and observation following other accident: Secondary | ICD-10-CM | POA: Diagnosis not present

## 2022-07-02 ENCOUNTER — Telehealth: Payer: Self-pay

## 2022-07-02 NOTE — Telephone Encounter (Signed)
Written Order for Breast Pump signed and faxed to [employer].  

## 2022-07-03 ENCOUNTER — Ambulatory Visit (INDEPENDENT_AMBULATORY_CARE_PROVIDER_SITE_OTHER): Payer: Medicaid Other | Admitting: Advanced Practice Midwife

## 2022-07-03 VITALS — BP 132/72 | HR 93 | Wt 251.0 lb

## 2022-07-03 DIAGNOSIS — M7918 Myalgia, other site: Secondary | ICD-10-CM

## 2022-07-03 DIAGNOSIS — W010XXD Fall on same level from slipping, tripping and stumbling without subsequent striking against object, subsequent encounter: Secondary | ICD-10-CM

## 2022-07-03 DIAGNOSIS — Z3A32 32 weeks gestation of pregnancy: Secondary | ICD-10-CM

## 2022-07-03 DIAGNOSIS — O9A213 Injury, poisoning and certain other consequences of external causes complicating pregnancy, third trimester: Secondary | ICD-10-CM

## 2022-07-03 DIAGNOSIS — Z3403 Encounter for supervision of normal first pregnancy, third trimester: Secondary | ICD-10-CM

## 2022-07-03 MED ORDER — CYCLOBENZAPRINE HCL 5 MG PO TABS: 5.0000 mg | ORAL_TABLET | Freq: Three times a day (TID) | ORAL | 0 refills | Status: AC | PRN

## 2022-07-03 NOTE — Progress Notes (Signed)
   PRENATAL VISIT NOTE  Subjective:  Megan Howell is a 21 y.o. G1P0 at [redacted]w[redacted]d being seen today for ongoing prenatal care.  She is currently monitored for the following issues for this low-risk pregnancy and has Seasonal allergies; Supervision of normal first pregnancy; Maternal atypical antibody; Cystic fibrosis carrier; and BMI 37.0-37.9, adult on their problem list.  Patient reports  pain after fall this weekend .  Contractions: Not present. Vag. Bleeding: None.  Movement: Present. Denies leaking of fluid.   The following portions of the patient's history were reviewed and updated as appropriate: allergies, current medications, past family history, past medical history, past social history, past surgical history and problem list.   Objective:   Vitals:   07/03/22 1523  BP: 132/72  Pulse: 93  Weight: 251 lb (113.9 kg)    Fetal Status: Fetal Heart Rate (bpm): 138 Fundal Height: 33 cm Movement: Present     General:  Alert, oriented and cooperative. Patient is in no acute distress.  Skin: Skin is warm and dry. No rash noted.   Cardiovascular: Normal heart rate noted  Respiratory: Normal respiratory effort, no problems with respiration noted  Abdomen: Soft, gravid, appropriate for gestational age.  Pain/Pressure: Present     Pelvic: Cervical exam deferred        Extremities: Normal range of motion.  Edema: None  Mental Status: Normal mood and affect. Normal behavior. Normal judgment and thought content.   Assessment and Plan:  Pregnancy: G1P0 at [redacted]w[redacted]d 1. Encounter for supervision of normal first pregnancy in third trimester --Anticipatory guidance about next visits/weeks of pregnancy given.   2. [redacted] weeks gestation of pregnancy   3. Traumatic injury during pregnancy in third trimester --Pt slipped and fell on wet surface while at [facility] last weekend. She went to ED and was monitored and discharged. She was told her cervix was short on [place].   --She denies any abdominal  cramping today.  4. Fall on same level from slipping, subsequent encounter   5. Musculoskeletal pain --Aching pain on left side/hip and groin pain since fall.  --Rest/ice/heat/warm bath/increase PO fluids/Tylenol/pregnancy support belt  - cyclobenzaprine (FLEXERIL) 5 MG tablet; Take 1-2 tablets (5-10 mg total) by mouth 3 (three) times daily as needed for muscle spasms.  Dispense: 30 tablet; Refill: 0   Preterm labor symptoms and general obstetric precautions including but not limited to vaginal bleeding, contractions, leaking of fluid and fetal movement were reviewed in detail with the patient. Please refer to After Visit Summary for other counseling recommendations.   No follow-ups on file.  Future Appointments  Date Time Provider Department Center  07/19/2022  1:00 PM Jerene Bears, MD [facility]  07/31/2022 10:55 AM Courtney Paris, Wilmer Floor, CNM [facility]  08/06/2022 10:35 AM Jerene Bears, MD [facility]  08/14/2022 10:55 AM Courtney Paris, Wilmer Floor, CNM [facility]  08/20/2022  1:35 PM Jerene Bears, MD [facility]  08/27/2022  1:35 PM Jerene Bears, MD [facility]    Sharen Counter, CNM

## 2022-07-19 ENCOUNTER — Encounter (HOSPITAL_BASED_OUTPATIENT_CLINIC_OR_DEPARTMENT_OTHER): Payer: Medicaid Other | Admitting: Obstetrics & Gynecology

## 2022-07-31 ENCOUNTER — Encounter (HOSPITAL_BASED_OUTPATIENT_CLINIC_OR_DEPARTMENT_OTHER): Payer: Medicaid Other | Admitting: Advanced Practice Midwife

## 2022-07-31 DIAGNOSIS — O26893 Other specified pregnancy related conditions, third trimester: Secondary | ICD-10-CM | POA: Diagnosis not present

## 2022-07-31 DIAGNOSIS — R102 Pelvic and perineal pain: Secondary | ICD-10-CM | POA: Diagnosis not present

## 2022-07-31 NOTE — Progress Notes (Signed)
[employer] breast pump form faxed.

## 2022-08-03 DIAGNOSIS — Z3A36 36 weeks gestation of pregnancy: Secondary | ICD-10-CM | POA: Diagnosis not present

## 2022-08-03 DIAGNOSIS — O99213 Obesity complicating pregnancy, third trimester: Secondary | ICD-10-CM | POA: Diagnosis not present

## 2022-08-06 ENCOUNTER — Telehealth (HOSPITAL_BASED_OUTPATIENT_CLINIC_OR_DEPARTMENT_OTHER): Payer: Self-pay | Admitting: Obstetrics & Gynecology

## 2022-08-06 ENCOUNTER — Encounter (HOSPITAL_BASED_OUTPATIENT_CLINIC_OR_DEPARTMENT_OTHER): Payer: Medicaid Other | Admitting: Obstetrics & Gynecology

## 2022-08-06 NOTE — Telephone Encounter (Signed)
Called patient and left about missed appointment today.

## 2022-08-08 DIAGNOSIS — R102 Pelvic and perineal pain: Secondary | ICD-10-CM | POA: Diagnosis not present

## 2022-08-08 DIAGNOSIS — M25552 Pain in left hip: Secondary | ICD-10-CM | POA: Diagnosis not present

## 2022-08-08 DIAGNOSIS — O26893 Other specified pregnancy related conditions, third trimester: Secondary | ICD-10-CM | POA: Diagnosis not present

## 2022-08-10 DIAGNOSIS — Z8481 Family history of carrier of genetic disease: Secondary | ICD-10-CM | POA: Diagnosis not present

## 2022-08-10 DIAGNOSIS — Z148 Genetic carrier of other disease: Secondary | ICD-10-CM | POA: Diagnosis not present

## 2022-08-10 DIAGNOSIS — O99824 Streptococcus B carrier state complicating childbirth: Secondary | ICD-10-CM | POA: Diagnosis not present

## 2022-08-10 DIAGNOSIS — Z141 Cystic fibrosis carrier: Secondary | ICD-10-CM | POA: Diagnosis not present

## 2022-08-10 DIAGNOSIS — Z3A37 37 weeks gestation of pregnancy: Secondary | ICD-10-CM | POA: Diagnosis not present

## 2022-08-10 DIAGNOSIS — O26893 Other specified pregnancy related conditions, third trimester: Secondary | ICD-10-CM | POA: Diagnosis not present

## 2022-08-10 DIAGNOSIS — O411230 Chorioamnionitis, third trimester, not applicable or unspecified: Secondary | ICD-10-CM | POA: Diagnosis not present

## 2022-08-10 DIAGNOSIS — O99214 Obesity complicating childbirth: Secondary | ICD-10-CM | POA: Diagnosis not present

## 2022-08-10 DIAGNOSIS — R102 Pelvic and perineal pain: Secondary | ICD-10-CM | POA: Diagnosis not present

## 2022-08-10 DIAGNOSIS — O368130 Decreased fetal movements, third trimester, not applicable or unspecified: Secondary | ICD-10-CM | POA: Diagnosis not present

## 2022-08-11 DIAGNOSIS — Z3A37 37 weeks gestation of pregnancy: Secondary | ICD-10-CM | POA: Diagnosis not present

## 2022-08-11 DIAGNOSIS — O288 Other abnormal findings on antenatal screening of mother: Secondary | ICD-10-CM | POA: Diagnosis not present

## 2022-08-11 DIAGNOSIS — O09893 Supervision of other high risk pregnancies, third trimester: Secondary | ICD-10-CM | POA: Insufficient documentation

## 2022-08-11 DIAGNOSIS — O411230 Chorioamnionitis, third trimester, not applicable or unspecified: Secondary | ICD-10-CM | POA: Insufficient documentation

## 2022-08-14 ENCOUNTER — Encounter (HOSPITAL_BASED_OUTPATIENT_CLINIC_OR_DEPARTMENT_OTHER): Payer: Medicaid Other | Admitting: Advanced Practice Midwife

## 2022-08-20 ENCOUNTER — Encounter (HOSPITAL_BASED_OUTPATIENT_CLINIC_OR_DEPARTMENT_OTHER): Payer: Medicaid Other | Admitting: Obstetrics & Gynecology

## 2022-08-27 ENCOUNTER — Encounter (HOSPITAL_BASED_OUTPATIENT_CLINIC_OR_DEPARTMENT_OTHER): Payer: Medicaid Other | Admitting: Obstetrics & Gynecology

## 2022-09-10 ENCOUNTER — Encounter: Payer: Self-pay | Admitting: *Deleted

## 2022-10-23 DIAGNOSIS — Z3A14 14 weeks gestation of pregnancy: Secondary | ICD-10-CM | POA: Diagnosis not present

## 2022-10-23 DIAGNOSIS — Z3A01 Less than 8 weeks gestation of pregnancy: Secondary | ICD-10-CM | POA: Diagnosis not present

## 2022-10-23 DIAGNOSIS — O209 Hemorrhage in early pregnancy, unspecified: Secondary | ICD-10-CM | POA: Diagnosis not present

## 2022-11-29 DIAGNOSIS — R9431 Abnormal electrocardiogram [ECG] [EKG]: Secondary | ICD-10-CM | POA: Diagnosis not present

## 2022-11-29 DIAGNOSIS — R45851 Suicidal ideations: Secondary | ICD-10-CM | POA: Diagnosis not present

## 2022-11-29 DIAGNOSIS — F32A Depression, unspecified: Secondary | ICD-10-CM | POA: Diagnosis not present

## 2022-11-30 DIAGNOSIS — F32A Depression, unspecified: Secondary | ICD-10-CM | POA: Diagnosis not present

## 2022-11-30 DIAGNOSIS — R45851 Suicidal ideations: Secondary | ICD-10-CM | POA: Diagnosis not present

## 2024-02-04 LAB — HM HEPATITIS C SCREENING LAB: HM Hepatitis Screen: NEGATIVE

## 2024-06-21 ENCOUNTER — Other Ambulatory Visit: Payer: Self-pay

## 2024-06-21 ENCOUNTER — Emergency Department (HOSPITAL_COMMUNITY)
Admission: EM | Admit: 2024-06-21 | Discharge: 2024-06-21 | Disposition: A | Discharge status: Home / Self Care | Payer: MEDICAID | Attending: Emergency Medicine | Admitting: Emergency Medicine

## 2024-06-21 ENCOUNTER — Encounter (HOSPITAL_COMMUNITY): Payer: Self-pay | Admitting: *Deleted

## 2024-06-21 DIAGNOSIS — Z7982 Long term (current) use of aspirin: Secondary | ICD-10-CM | POA: Diagnosis not present

## 2024-06-21 DIAGNOSIS — L0291 Cutaneous abscess, unspecified: Secondary | ICD-10-CM

## 2024-06-21 DIAGNOSIS — N764 Abscess of vulva: Secondary | ICD-10-CM | POA: Insufficient documentation

## 2024-06-21 LAB — COMPREHENSIVE METABOLIC PANEL WITH GFR
ALT: 13 U/L (ref 0–44)
AST: 16 U/L (ref 15–41)
Albumin: 3.2 g/dL — ABNORMAL LOW (ref 3.5–5.0)
Alkaline Phosphatase: 46 U/L (ref 38–126)
Anion gap: 8 (ref 5–15)
BUN: 6 mg/dL (ref 6–20)
CO2: 24 mmol/L (ref 22–32)
Calcium: 9 mg/dL (ref 8.9–10.3)
Chloride: 103 mmol/L (ref 98–111)
Creatinine, Ser: 0.82 mg/dL (ref 0.44–1.00)
GFR, Estimated: >60 mL/min (ref >60)
Glucose, Bld: 87 mg/dL (ref 70–99)
Potassium: 3.4 mmol/L — ABNORMAL LOW (ref 3.5–5.1)
Sodium: 135 mmol/L (ref 135–145)
Total Bilirubin: 0.6 mg/dL (ref 0.0–1.2)
Total Protein: 6.3 g/dL — ABNORMAL LOW (ref 6.5–8.1)

## 2024-06-21 LAB — URINALYSIS, MICROSCOPIC (REFLEX)

## 2024-06-21 LAB — URINALYSIS, ROUTINE W REFLEX MICROSCOPIC
Bilirubin Urine: NEGATIVE
Glucose, UA: NEGATIVE mg/dL
Ketones, ur: 40 mg/dL — AB
Nitrite: NEGATIVE
Protein, ur: NEGATIVE mg/dL
Specific Gravity, Urine: 1.02 (ref 1.005–1.030)
pH: 7 (ref 5.0–8.0)

## 2024-06-21 LAB — CBC
HCT: 31.3 % — ABNORMAL LOW (ref 36.0–46.0)
Hemoglobin: 9.4 g/dL — ABNORMAL LOW (ref 12.0–15.0)
MCH: 19 pg — ABNORMAL LOW (ref 26.0–34.0)
MCHC: 30 g/dL (ref 30.0–36.0)
MCV: 63.4 fL — ABNORMAL LOW (ref 80.0–100.0)
Platelets: 375 K/uL (ref 150–400)
RBC: 4.94 MIL/uL (ref 3.87–5.11)
RDW: 16.5 % — ABNORMAL HIGH (ref 11.5–15.5)
WBC: 12.8 K/uL — ABNORMAL HIGH (ref 4.0–10.5)
nRBC: 0 % (ref 0.0–0.2)

## 2024-06-21 LAB — HCG, SERUM, QUALITATIVE: Preg, Serum: NEGATIVE

## 2024-06-21 MED ORDER — FLUCONAZOLE 200 MG PO TABS: 200.0000 mg | ORAL_TABLET | Freq: Every day | ORAL | 0 refills | Status: AC | PRN

## 2024-06-21 MED ORDER — DOXYCYCLINE HYCLATE 100 MG PO CAPS: 100.0000 mg | ORAL_CAPSULE | Freq: Two times a day (BID) | ORAL | 0 refills | Status: AC

## 2024-06-21 MED ORDER — IBUPROFEN 600 MG PO TABS: 600.0000 mg | ORAL_TABLET | Freq: Four times a day (QID) | ORAL | 0 refills | Status: AC | PRN

## 2024-06-21 MED ORDER — DOXYCYCLINE HYCLATE 100 MG PO TABS
100.0000 mg | ORAL_TABLET | Freq: Once | ORAL | Status: AC
  Administered 2024-06-21: 100 mg via ORAL
  Filled 2024-06-21: qty 1

## 2024-06-21 MED ORDER — FLUCONAZOLE 200 MG PO TABS
200.0000 mg | ORAL_TABLET | Freq: Once | ORAL | Status: AC
  Administered 2024-06-21: 200 mg via ORAL
  Filled 2024-06-21: qty 1

## 2024-06-21 MED ORDER — IBUPROFEN 800 MG PO TABS
800.0000 mg | ORAL_TABLET | Freq: Once | ORAL | Status: AC
  Administered 2024-06-21: 800 mg via ORAL
  Filled 2024-06-21: qty 1

## 2024-06-21 NOTE — ED Provider Notes (Signed)
 [place] EMERGENCY DEPARTMENT AT [facility] Provider Note   CSN: [id number] Arrival date & time: 06/21/24  1927     Patient presents with: Mass   Megan Howell is a 23 y.o. female presenting to ED with complaint of painful mass below her vagina, ongoing for few days.  She says it may have began as an ingrown hair.   HPI     Prior to Admission medications   Medication Sig Start Date End Date Taking? Authorizing Provider  doxycycline (VIBRAMYCIN) 100 MG capsule Take 1 capsule (100 mg total) by mouth 2 (two) times daily for 7 days. 06/21/24 06/28/24 Yes Jacorion Klem, Donnice PARAS, MD  fluconazole (DIFLUCAN) 200 MG tablet Take 1 tablet (200 mg total) by mouth daily as needed for up to 2 doses. 06/21/24  Yes Brave Dack, Donnice PARAS, MD  ibuprofen  (ADVIL ) 600 MG tablet Take 1 tablet (600 mg total) by mouth every 6 (six) hours as needed for up to 30 doses for mild pain (pain score 1-3) or moderate pain (pain score 4-6). 06/21/24  Yes Cottie Donnice PARAS, MD  aspirin  EC 81 MG tablet Take 1 tablet (81 mg total) by mouth daily. Swallow whole. 05/23/22   Cleotilde Ronal RAMAN, MD  cyclobenzaprine  (FLEXERIL ) 5 MG tablet Take 1-2 tablets (5-10 mg total) by mouth 3 (three) times daily as needed for muscle spasms. 07/03/22   Leftwich-Kirby, Olam LABOR, CNM  ferrous sulfate 325 (65 FE) MG tablet Take 325 mg by mouth daily with breakfast.    [provider]  fexofenadine  (ALLEGRA ) 60 MG tablet Take 1 tablet (60 mg total) by mouth 2 (two) times daily. For allergy symptom control 11/16/20   Stanley, Angela J, MD  Prenatal Vit-Fe Fumarate-FA (PRENATAL MULTIVITAMIN) TABS tablet Take 1 tablet by mouth daily at 12 noon.    [provider]  terconazole  (TERAZOL 7 ) 0.4 % vaginal cream Place 1 applicator vaginally at bedtime. 06/04/22   Cleotilde Ronal RAMAN, MD    Allergies: Patient has no known allergies.    Review of Systems  Updated Vital Signs BP 132/65 (BP Location: Right Arm)   Pulse 86   Temp 98.1 F  (36.7 C)   Resp 16   Ht 5' 7 (1.702 m)   Wt 113.9 kg   LMP 06/11/2024   SpO2 99%   BMI 39.33 kg/m   Physical Exam Constitutional:      General: She is not in acute distress. HENT:     Head: Normocephalic and atraumatic.  Eyes:     Conjunctiva/sclera: Conjunctivae normal.     Pupils: Pupils are equal, round, and reactive to light.  Cardiovascular:     Rate and Rhythm: Normal rate and regular rhythm.  Pulmonary:     Effort: Pulmonary effort is normal. No respiratory distress.  Abdominal:     General: There is no distension.     Tenderness: There is no abdominal tenderness.  Genitourinary:    Comments: GU exam was performed with the nurse as chaperone present Recardo Glaser Normal external vaginal exam, no labial abscess.  On the right perineal aspect there is region of induration, near the bottom of the gluteal cleft, with some spontaneous purulent drainage Skin:    General: Skin is warm and dry.  Neurological:     General: No focal deficit present.     Mental Status: She is alert. Mental status is at baseline.  Psychiatric:        Mood and Affect: Mood normal.  Behavior: Behavior normal.     (all labs ordered are listed, but only abnormal results are displayed) Labs Reviewed  COMPREHENSIVE METABOLIC PANEL WITH GFR - Abnormal; Notable for the following components:      Result Value   Potassium 3.4 (*)    Total Protein 6.3 (*)    Albumin 3.2 (*)    All other components within normal limits  CBC - Abnormal; Notable for the following components:   WBC 12.8 (*)    Hemoglobin 9.4 (*)    HCT 31.3 (*)    MCV 63.4 (*)    MCH 19.0 (*)    RDW 16.5 (*)    All other components within normal limits  URINALYSIS, ROUTINE W REFLEX MICROSCOPIC - Abnormal; Notable for the following components:   Hgb urine dipstick TRACE (*)    Ketones, ur 40 (*)    Leukocytes,Ua TRACE (*)    All other components within normal limits  URINALYSIS, MICROSCOPIC (REFLEX) - Abnormal; Notable  for the following components:   Bacteria, UA RARE (*)    All other components within normal limits  HCG, SERUM, QUALITATIVE    EKG: None  Radiology: No results found.   Procedures   Medications Ordered in the ED  doxycycline (VIBRA-TABS) tablet 100 mg (has no administration in time range)  fluconazole (DIFLUCAN) tablet 200 mg (has no administration in time range)  ibuprofen  (ADVIL ) tablet 800 mg (has no administration in time range)                                    Medical Decision Making Amount and/or Complexity of Data Reviewed Labs: ordered.  Risk Prescription drug management.   Patient is here with concern for a perineal abscess, likely began as an ingrown hair from shaving.  Patient systemically is well-appearing.  Afebrile.  Very minor leukocytosis.  I performed a bedside soft tissue ultrasound which noted a superficial collection, measuring no greater than 1 cm, directly beneath the skin, which was spontaneously draining.  I was able to express some additional pus, there does not appear to be enough additional underlying infection for I&D.  We'll start her on doxycycline antibiotic, I recommended a warm soak tonight to help express any remaining pus, and also ibuprofen  and Tylenol as needed for pain.  I have a lower suspicion for deep space tunneling infection.  Symptoms are failing to improve, she has persistent fevers, she is instructed to return back to the ED.     Final diagnoses:  Abscess    ED Discharge Orders          Ordered    doxycycline (VIBRAMYCIN) 100 MG capsule  2 times daily        06/21/24 2036    fluconazole (DIFLUCAN) 200 MG tablet  Daily PRN        06/21/24 2036    ibuprofen  (ADVIL ) 600 MG tablet  Every 6 hours PRN        06/21/24 2036               Cottie Donnice PARAS, MD 06/21/24 2057

## 2024-06-21 NOTE — ED Triage Notes (Signed)
 Nurse First Note: Pt reports having a painful bump in her groin area that has had drainage and now she is having fevers.

## 2024-08-27 NOTE — Patient Instructions (Incomplete)

## 2024-08-28 ENCOUNTER — Ambulatory Visit: Payer: MEDICAID | Admitting: Sports Medicine
